# Patient Record
Sex: Female | Born: 1935 | Race: White | Hispanic: No
Health system: Southern US, Community
[De-identification: ages and names within clinical notes are randomized; demographics above are authoritative.]

## PROBLEM LIST (undated history)

## (undated) DIAGNOSIS — A419 Sepsis, unspecified organism: Secondary | ICD-10-CM

## (undated) DIAGNOSIS — B20 Human immunodeficiency virus [HIV] disease: Secondary | ICD-10-CM

## (undated) DIAGNOSIS — G894 Chronic pain syndrome: Secondary | ICD-10-CM

## (undated) DIAGNOSIS — N390 Urinary tract infection, site not specified: Secondary | ICD-10-CM

## (undated) DIAGNOSIS — Z9189 Other specified personal risk factors, not elsewhere classified: Secondary | ICD-10-CM

## (undated) DIAGNOSIS — J9621 Acute and chronic respiratory failure with hypoxia: Secondary | ICD-10-CM

---

## 2018-05-02 ENCOUNTER — Other Ambulatory Visit (HOSPITAL_COMMUNITY): Payer: Medicare Other

## 2018-05-02 ENCOUNTER — Inpatient Hospital Stay
Admission: RE | Admit: 2018-05-02 | Discharge: 2018-05-17 | Disposition: E | Payer: Medicare Other | Source: Ambulatory Visit | Attending: Internal Medicine | Admitting: Internal Medicine

## 2018-05-02 DIAGNOSIS — J9621 Acute and chronic respiratory failure with hypoxia: Secondary | ICD-10-CM

## 2018-05-02 DIAGNOSIS — A419 Sepsis, unspecified organism: Secondary | ICD-10-CM

## 2018-05-02 DIAGNOSIS — J9690 Respiratory failure, unspecified, unspecified whether with hypoxia or hypercapnia: Secondary | ICD-10-CM

## 2018-05-02 DIAGNOSIS — G894 Chronic pain syndrome: Secondary | ICD-10-CM

## 2018-05-02 DIAGNOSIS — Z9189 Other specified personal risk factors, not elsewhere classified: Secondary | ICD-10-CM

## 2018-05-02 DIAGNOSIS — Z0189 Encounter for other specified special examinations: Secondary | ICD-10-CM

## 2018-05-02 DIAGNOSIS — N390 Urinary tract infection, site not specified: Secondary | ICD-10-CM

## 2018-05-02 DIAGNOSIS — Z4659 Encounter for fitting and adjustment of other gastrointestinal appliance and device: Secondary | ICD-10-CM

## 2018-05-02 DIAGNOSIS — R6520 Severe sepsis without septic shock: Secondary | ICD-10-CM

## 2018-05-02 HISTORY — DX: Other specified personal risk factors, not elsewhere classified: Z91.89

## 2018-05-02 HISTORY — DX: Sepsis, unspecified organism: A41.9

## 2018-05-02 HISTORY — DX: Urinary tract infection, site not specified: N39.0

## 2018-05-02 HISTORY — DX: Human immunodeficiency virus (HIV) disease: B20

## 2018-05-02 HISTORY — DX: Acute and chronic respiratory failure with hypoxia: J96.21

## 2018-05-02 HISTORY — DX: Chronic pain syndrome: G89.4

## 2018-05-02 LAB — BLOOD GAS, ARTERIAL
Acid-base deficit: 2.8 mmol/L — ABNORMAL HIGH (ref 0.0–2.0)
BICARBONATE: 19.9 mmol/L — AB (ref 20.0–28.0)
FIO2: 35
LHR: 16 {breaths}/min
MECHVT: 500 mL
O2 Saturation: 98.4 %
PEEP: 5 cmH2O
PO2 ART: 105 mmHg (ref 83.0–108.0)
Patient temperature: 98.6
pCO2 arterial: 25.7 mmHg — ABNORMAL LOW (ref 32.0–48.0)
pH, Arterial: 7.501 — ABNORMAL HIGH (ref 7.350–7.450)

## 2018-05-03 ENCOUNTER — Other Ambulatory Visit (HOSPITAL_COMMUNITY): Payer: Medicare Other

## 2018-05-03 LAB — COMPREHENSIVE METABOLIC PANEL
ALT: 16 U/L (ref 0–44)
ANION GAP: 15 (ref 5–15)
AST: 12 U/L — AB (ref 15–41)
Albumin: 2.2 g/dL — ABNORMAL LOW (ref 3.5–5.0)
Alkaline Phosphatase: 109 U/L (ref 38–126)
BUN: 29 mg/dL — AB (ref 8–23)
CHLORIDE: 107 mmol/L (ref 98–111)
CO2: 17 mmol/L — ABNORMAL LOW (ref 22–32)
Calcium: 8.5 mg/dL — ABNORMAL LOW (ref 8.9–10.3)
Creatinine, Ser: 0.56 mg/dL (ref 0.44–1.00)
GFR calc Af Amer: >60 mL/min (ref >60)
GFR calc non Af Amer: >60 mL/min (ref >60)
GLUCOSE: 143 mg/dL — AB (ref 70–99)
POTASSIUM: 3.3 mmol/L — AB (ref 3.5–5.1)
SODIUM: 139 mmol/L (ref 135–145)
Total Bilirubin: 0.9 mg/dL (ref 0.3–1.2)
Total Protein: 4.8 g/dL — ABNORMAL LOW (ref 6.5–8.1)

## 2018-05-03 LAB — CBC WITH DIFFERENTIAL/PLATELET
BAND NEUTROPHILS: 1 %
BASOS ABS: 0 10*3/uL (ref 0.0–0.1)
Basophils Relative: 0 %
Blasts: 0 %
EOS ABS: 0 10*3/uL (ref 0.0–0.7)
EOS PCT: 1 %
HCT: 31.2 % — ABNORMAL LOW (ref 36.0–46.0)
HEMOGLOBIN: 9.7 g/dL — AB (ref 12.0–15.0)
LYMPHS ABS: 0.6 10*3/uL — AB (ref 0.7–4.0)
LYMPHS PCT: 15 %
MCH: 27.8 pg (ref 26.0–34.0)
MCHC: 31.1 g/dL (ref 30.0–36.0)
MCV: 89.4 fL (ref 78.0–100.0)
METAMYELOCYTES PCT: 0 %
MONOS PCT: 11 %
Monocytes Absolute: 0.5 10*3/uL (ref 0.1–1.0)
Myelocytes: 0 %
NEUTROS ABS: 3.1 10*3/uL (ref 1.7–7.7)
Neutrophils Relative %: 72 %
OTHER: 0 %
Platelets: 206 10*3/uL (ref 150–400)
Promyelocytes Relative: 0 %
RBC: 3.49 MIL/uL — AB (ref 3.87–5.11)
Smear Review: ADEQUATE
WBC: 4.2 10*3/uL (ref 4.0–10.5)
nRBC: 0 /100 WBC

## 2018-05-03 MED ORDER — IOPAMIDOL (ISOVUE-300) INJECTION 61%
50.0000 mL | Freq: Once | INTRAVENOUS | Status: AC | PRN
  Administered 2018-05-03: 15 mL

## 2018-05-03 MED ORDER — LIDOCAINE VISCOUS HCL 2 % MT SOLN
OROMUCOSAL | Status: AC
  Administered 2018-05-03: 3 mL via OROMUCOSAL
  Filled 2018-05-03: qty 15

## 2018-05-03 MED ORDER — LIDOCAINE VISCOUS HCL 2 % MT SOLN
15.0000 mL | Freq: Once | OROMUCOSAL | Status: AC
  Administered 2018-05-03: 3 mL via OROMUCOSAL

## 2018-05-04 ENCOUNTER — Encounter: Payer: Self-pay | Admitting: Internal Medicine

## 2018-05-04 DIAGNOSIS — B20 Human immunodeficiency virus [HIV] disease: Secondary | ICD-10-CM

## 2018-05-04 DIAGNOSIS — Z9189 Other specified personal risk factors, not elsewhere classified: Secondary | ICD-10-CM

## 2018-05-04 DIAGNOSIS — J9621 Acute and chronic respiratory failure with hypoxia: Secondary | ICD-10-CM

## 2018-05-04 DIAGNOSIS — A419 Sepsis, unspecified organism: Secondary | ICD-10-CM

## 2018-05-04 DIAGNOSIS — G894 Chronic pain syndrome: Secondary | ICD-10-CM | POA: Diagnosis not present

## 2018-05-04 DIAGNOSIS — N390 Urinary tract infection, site not specified: Secondary | ICD-10-CM

## 2018-05-04 HISTORY — DX: Chronic pain syndrome: G89.4

## 2018-05-04 HISTORY — DX: Acute and chronic respiratory failure with hypoxia: J96.21

## 2018-05-04 HISTORY — DX: Sepsis, unspecified organism: A41.9

## 2018-05-04 HISTORY — DX: Other specified personal risk factors, not elsewhere classified: Z91.89

## 2018-05-04 HISTORY — DX: Urinary tract infection, site not specified: N39.0

## 2018-05-04 LAB — POTASSIUM: POTASSIUM: 3.5 mmol/L (ref 3.5–5.1)

## 2018-05-04 NOTE — Consult Note (Signed)
[facility]  Date of Service: 05/04/2018  PULMONARY CRITICAL CARE CONSULT   Erika BalzarineJanette B Torres  ZOX:[id number]  DOB: 11/10/1931   DOA: 04/19/2018  Referring Physician: Carron CurieAli Hijazi, MD  HPI: Erika Torres is a 84 y.o. female seen for follow up of Acute on Chronic Respiratory Failure.  Patient has full medical problems including coronary artery disease chronic pain syndrome hip arthroplasty came into the hospital at the transferring facility with increasing shortness of breath.  Patient also was having been having orthopnea.  With orthopnea she had been noted to have a hemoglobin of 7.3 was transfused and GI consultation was ordered at a previous admission.  Patient apparently refused the EGD and colonoscopy at that time.  Upon her readmission her hemoglobin had been 10.1 and she had an abnormal urine exam suggesting urinary tract infection.  Patient was started on Rocephin and admitted for further management.  Based on the discharge summary are not quite clear as to what was concluded as far as her shortness of breath is concerned.  It appears that they felt the patient was septic and this was contributing to her shortness of breath.  Review of Systems:  ROS performed and is unremarkable other than noted above.  Past Medical History:  Diagnosis Date  . Anxiety and depression  . Arthritis  . Chronic back pain  . Coronary artery disease  . Fractures  . History of blood transfusion  . History of UTI  . Iron deficiency anemia   Past Surgical History:  Procedure Laterality Date  . HYSTERECTOMY  . ORTHOPEDIC SURGERY     Social History:   No history of alcohol drug or tobacco use  Family History: Non-Contributory to the present illness    Medications: Reviewed on Rounds  Physical Exam:  Vitals: Temperature 97.0 pulse 77 respiratory rate 16 blood pressure 110/63 saturations are 100%  Ventilator Settings  mode of ventilation assist control FiO2 28% tidal volume 524 PEEP 5  . General: Comfortable at this time . Eyes: Grossly normal lids, irises & conjunctiva . ENT: grossly tongue is normal . Neck: no obvious mass . Cardiovascular: S1-S2 normal no gallop or rub . Respiratory: Coarse rhonchi noted bilaterally . Abdomen: Soft and nontender . Skin: no rash seen on limited exam . Musculoskeletal: not rigid . Psychiatric:unable to assess . Neurologic: no seizure no involuntary movements         Labs on Admission:  Basic Metabolic Panel: Recent Labs  Lab 05/03/18 0629 05/04/18 0210  NA 139  --   K 3.3* 3.5  CL 107  --   CO2 17*  --   GLUCOSE 143*  --   BUN 29*  --   CREATININE 0.56  --   CALCIUM 8.5*  --     Recent Labs  Lab 04/29/2018 1900  PHART 7.501*  PCO2ART 25.7*  PO2ART 105  HCO3 19.9*  O2SAT 98.4    Liver Function Tests: Recent Labs  Lab 05/03/18 0629  AST 12*  ALT 16  ALKPHOS 109  BILITOT 0.9  PROT 4.8*  ALBUMIN 2.2*   No results for input(s): LIPASE, AMYLASE in the last 168 hours. No results for input(s): AMMONIA in the last 168 hours.  CBC: Recent Labs  Lab 05/03/18 0629  WBC 4.2  NEUTROABS 3.1  HGB 9.7*  HCT 31.2*  MCV 89.4  PLT 206    Cardiac Enzymes: No results for input(s): CKTOTAL, CKMB, CKMBINDEX, TROPONINI in the  last 168 hours.  BNP (last 3 results) No results for input(s): BNP in the last 8760 hours.  ProBNP (last 3 results) No results for input(s): PROBNP in the last 8760 hours.   Radiological Exams on Admission: Dg Abd 1 View  Result Date: 05/03/2018 CLINICAL DATA:  Fluoroscopic nasogastric tube placement EXAM: ABDOMEN - 1 VIEW COMPARISON:  None. FINDINGS: Fluoroscopic assistance was provided during placement of a nasoenteric feeding tube. The provided image shows contrast injection opacifying a loop of small bowel. Fluoroscopy time was reported as 4 minutes 42 seconds. The contrast used was 15 mL Isovue-300. IMPRESSION:  Fluoroscopic assistance during feeding tube placement with contrast opacification of proximal small bowel. Electronically Signed   By: Deatra Robinson M.D.   On: 05/03/2018 15:29   Dg Chest Port 1 View  Result Date: 04/17/2018 CLINICAL DATA:  Encounter for ETT placement. EXAM: PORTABLE CHEST 1 VIEW COMPARISON:  None. FINDINGS: Normal heart size. BILATERAL pleural effusions, with basilar compressive atelectasis. No definite consolidation. ET tube 4.5 cm above carina. No visible pneumothorax. Osteopenia, multiple thoracic compression fractures. Central venous catheter tip cavoatrial junction from RIGHT IJ approach. IMPRESSION: ET tube 4.5 cm above carina. Central venous line tip cavoatrial junction. No pneumothorax. BILATERAL pleural effusions as described. Electronically Signed   By: Elsie Stain M.D.   On: 04/20/2018 17:10   Dg Abd Portable 1v  Result Date: 05/04/2018 CLINICAL DATA:  Encounter for NG tube placement. EXAM: PORTABLE ABDOMEN - 1 VIEW COMPARISON:  None. FINDINGS: The bowel gas pattern is normal. No radio-opaque calculi or other significant radiographic abnormality are seen. IMPRESSION: Unremarkable supine radiograph. A nasogastric tube is not identified in the chest or stomach. Electronically Signed   By: Elsie Stain M.D.   On: 04/27/2018 17:11    Assessment/Plan Principal Problem:   Acute on chronic respiratory failure with hypoxia (HCC) Active Problems:   Sepsis associated with acquired immune deficiency syndrome (AIDS) (HCC)   Chronic pain syndrome   Urinary tract infection   At high risk for airway occlusion   1. Acute on chronic respiratory failure with hypoxia right now patient is on full vent support and assist control mode.  Patient remains on a propofol drip which of course makes it difficult for any weaning attempts.  We will try to work on trying to titrate the propofol down to off.  We will continue with support on the ventilator.  She has an ET tube in place we will  assess once she is more awake if she may end up having to have a tracheostomy. 2. Sepsis patient was treated for sepsis secondary to urinary tract infection.  Patient right now is hemodynamically stable.  We will continue with full support and pressors as needed. 3. Chronic pain syndrome we will continue with current management pain meds as needed.  She is now more than adequately sedated. 4. High risk airway patient has an endotracheal tube in place as noted.  She is at high risk for extubation patient right now is sedated on propofol we will continue with full support and continue with continuous monitoring  I have personally seen and evaluated the patient, evaluated laboratory and imaging results, formulated the assessment and plan and placed orders. The Patient requires high complexity decision making for assessment and support.  Case was discussed on Rounds with the Respiratory Therapy Staff Time Spent patient is critically ill in danger of cardiac arrest with high risk airway as well as high risk drips  Yevonne Pax,  MD [facility] Pulmonary Critical Care Medicine Sleep Medicine

## 2018-05-05 DIAGNOSIS — J9621 Acute and chronic respiratory failure with hypoxia: Secondary | ICD-10-CM | POA: Diagnosis not present

## 2018-05-05 DIAGNOSIS — G894 Chronic pain syndrome: Secondary | ICD-10-CM | POA: Diagnosis not present

## 2018-05-05 DIAGNOSIS — Z9189 Other specified personal risk factors, not elsewhere classified: Secondary | ICD-10-CM | POA: Diagnosis not present

## 2018-05-05 DIAGNOSIS — R6520 Severe sepsis without septic shock: Secondary | ICD-10-CM

## 2018-05-05 DIAGNOSIS — A419 Sepsis, unspecified organism: Secondary | ICD-10-CM | POA: Diagnosis not present

## 2018-05-05 NOTE — Progress Notes (Signed)
Pulmonary Critical Care Medicine [facility] [place]   PULMONARY CRITICAL CARE SERVICE  PROGRESS NOTE  Date of Service: 05/05/2018  Erika Torres  ZOX:[id number]  DOB: 1932/05/02   DOA: 2018/05/26  Referring Physician: Carron Curie, MD  HPI: Erika Torres is a 84 y.o. female seen for follow up of Acute on Chronic Respiratory Failure.  Patient currently is on full support right now has been on assist control mode patient is supposed to start pressure support wean with 4-hour goal  Medications: Reviewed on Rounds  Physical Exam:  Vitals: Temperature 97.2 pulse 91 respiratory rate 16 blood pressure 136/76 saturations 96%  Ventilator Settings mode of ventilation assist control FiO2 28% tidal volume 518 PEEP 5  . General: Comfortable at this time . Eyes: Grossly normal lids, irises & conjunctiva . ENT: grossly tongue is normal . Neck: no obvious mass . Cardiovascular: S1 S2 normal no gallop . Respiratory: No rhonchi no rales are noted . Abdomen: soft . Skin: no rash seen on limited exam . Musculoskeletal: not rigid . Psychiatric:unable to assess . Neurologic: no seizure no involuntary movements         Lab Data:   Basic Metabolic Panel: Recent Labs  Lab 05/03/18 0629 05/04/18 0210  NA 139  --   K 3.3* 3.5  CL 107  --   CO2 17*  --   GLUCOSE 143*  --   BUN 29*  --   CREATININE 0.56  --   CALCIUM 8.5*  --     ABG: Recent Labs  Lab 05-26-18 1900  PHART 7.501*  PCO2ART 25.7*  PO2ART 105  HCO3 19.9*  O2SAT 98.4    Liver Function Tests: Recent Labs  Lab 05/03/18 0629  AST 12*  ALT 16  ALKPHOS 109  BILITOT 0.9  PROT 4.8*  ALBUMIN 2.2*   No results for input(s): LIPASE, AMYLASE in the last 168 hours. No results for input(s): AMMONIA in the last 168 hours.  CBC: Recent Labs  Lab 05/03/18 0629  WBC 4.2  NEUTROABS 3.1  HGB 9.7*  HCT 31.2*  MCV 89.4  PLT 206    Cardiac Enzymes: No results for input(s): CKTOTAL, CKMB, CKMBINDEX,  TROPONINI in the last 168 hours.  BNP (last 3 results) No results for input(s): BNP in the last 8760 hours.  ProBNP (last 3 results) No results for input(s): PROBNP in the last 8760 hours.  Radiological Exams: Dg Abd 1 View  Result Date: 05/03/2018 CLINICAL DATA:  Fluoroscopic nasogastric tube placement EXAM: ABDOMEN - 1 VIEW COMPARISON:  None. FINDINGS: Fluoroscopic assistance was provided during placement of a nasoenteric feeding tube. The provided image shows contrast injection opacifying a loop of small bowel. Fluoroscopy time was reported as 4 minutes 42 seconds. The contrast used was 15 mL [id number]-300. IMPRESSION: Fluoroscopic assistance during feeding tube placement with contrast opacification of proximal small bowel. Electronically Signed   By: Deatra Robinson M.D.   On: 05/03/2018 15:29    Assessment/Plan Principal Problem:   Acute on chronic respiratory failure with hypoxia (HCC) Active Problems:   Chronic pain syndrome   Urinary tract infection   At high risk for airway occlusion   Severe sepsis (HCC)   1. Acute on chronic respiratory failure with hypoxia we will continue with the weaning on pressure support patient should be starting that today with a goal of 4 hours. 2. Sepsis with shock continue with supportive care hemodynamically stable at this time. 3. Chronic pain syndrome continue with supportive care.  4. Urinary tract infection treated 5. High risk airway she has an ET tube in place we are going to try to continue to wean if not able to then she will need a tracheostomy.   I have personally seen and evaluated the patient, evaluated laboratory and imaging results, formulated the assessment and plan and placed orders. The Patient requires high complexity decision making for assessment and support.  Case was discussed on Rounds with the Respiratory Therapy Staff  Yevonne PaxSaadat A Khan, MD [facility] Pulmonary Critical Care Medicine Sleep Medicine

## 2018-05-06 DIAGNOSIS — G894 Chronic pain syndrome: Secondary | ICD-10-CM | POA: Diagnosis not present

## 2018-05-06 DIAGNOSIS — Z9189 Other specified personal risk factors, not elsewhere classified: Secondary | ICD-10-CM | POA: Diagnosis not present

## 2018-05-06 DIAGNOSIS — A419 Sepsis, unspecified organism: Secondary | ICD-10-CM | POA: Diagnosis not present

## 2018-05-06 DIAGNOSIS — J9621 Acute and chronic respiratory failure with hypoxia: Secondary | ICD-10-CM | POA: Diagnosis not present

## 2018-05-06 NOTE — Progress Notes (Signed)
[facility] [place]   PULMONARY CRITICAL CARE SERVICE  PROGRESS NOTE  Date of Service: 05/06/2018  Erika Torres  ZOX:[id number]  DOB: 07/04/1932   DOA: 04/30/2018  Referring Physician: Carron CurieAli Hijazi, MD  HPI: Erika Torres is a 84 y.o. female seen for follow up of Acute on Chronic Respiratory Failure.  Patient is on full support yesterday was able to do pressure support wean for only about 1 hour  Medications: Reviewed on Rounds  Physical Exam:  Vitals: Temperature 98.7 pulse 94 respiratory 18 blood pressure 118/62 saturations are 97%  Ventilator Settings mode of ventilation assist control FiO2 28% tidal volume 495 PEEP 5  . General: Comfortable at this time . Eyes: Grossly normal lids, irises & conjunctiva . ENT: grossly tongue is normal . Neck: no obvious mass . Cardiovascular: S1 S2 normal no gallop . Respiratory: Scattered rhonchi expansion is equal . Abdomen: soft . Skin: no rash seen on limited exam . Musculoskeletal: not rigid . Psychiatric:unable to assess . Neurologic: no seizure no involuntary movements         Lab Data:   Basic Metabolic Panel: Recent Labs  Lab 05/03/18 0629 05/04/18 0210  NA 139  --   K 3.3* 3.5  CL 107  --   CO2 17*  --   GLUCOSE 143*  --   BUN 29*  --   CREATININE 0.56  --   CALCIUM 8.5*  --     ABG: Recent Labs  Lab 04/30/2018 1900  PHART 7.501*  PCO2ART 25.7*  PO2ART 105  HCO3 19.9*  O2SAT 98.4    Liver Function Tests: Recent Labs  Lab 05/03/18 0629  AST 12*  ALT 16  ALKPHOS 109  BILITOT 0.9  PROT 4.8*  ALBUMIN 2.2*   No results for input(s): LIPASE, AMYLASE in the last 168 hours. No results for input(s): AMMONIA in the last 168 hours.  CBC: Recent Labs  Lab 05/03/18 0629  WBC 4.2  NEUTROABS 3.1  HGB 9.7*  HCT 31.2*  MCV 89.4  PLT 206    Cardiac Enzymes: No results for input(s): CKTOTAL, CKMB, CKMBINDEX, TROPONINI in the last 168 hours.  BNP (last  3 results) No results for input(s): BNP in the last 8760 hours.  ProBNP (last 3 results) No results for input(s): PROBNP in the last 8760 hours.  Radiological Exams: No results found.  Assessment/Plan Principal Problem:   Acute on chronic respiratory failure with hypoxia (HCC) Active Problems:   Chronic pain syndrome   Urinary tract infection   At high risk for airway occlusion   Severe sepsis (HCC)   1. Acute on chronic respiratory failure with hypoxia we will continue with full support and try to wean on pressure support again today. 2. Chronic pain syndrome controlled we will monitor 3. Urinary tract infection treated continue with supportive care 4. Severe sepsis syndrome hemodynamically stable we will continue with supportive care 5. High risk airway we will continue airway precautions prognosis guarded   I have personally seen and evaluated the patient, evaluated laboratory and imaging results, formulated the assessment and plan and placed orders. The Patient requires high complexity decision making for assessment and support.  Case was discussed on Rounds with the Respiratory Therapy Staff  Yevonne PaxSaadat A Ezzie Senat, MD [facility] Pulmonary Critical Care Medicine Sleep Medicine

## 2018-05-07 DIAGNOSIS — J9621 Acute and chronic respiratory failure with hypoxia: Secondary | ICD-10-CM | POA: Diagnosis not present

## 2018-05-07 DIAGNOSIS — Z9189 Other specified personal risk factors, not elsewhere classified: Secondary | ICD-10-CM | POA: Diagnosis not present

## 2018-05-07 DIAGNOSIS — A419 Sepsis, unspecified organism: Secondary | ICD-10-CM | POA: Diagnosis not present

## 2018-05-07 DIAGNOSIS — G894 Chronic pain syndrome: Secondary | ICD-10-CM | POA: Diagnosis not present

## 2018-05-07 NOTE — Progress Notes (Signed)
[facility] [place]   PULMONARY CRITICAL CARE SERVICE  PROGRESS NOTE  Date of Service: 05/07/2018  Drema BalzarineJanette B Gonsalez  ZOX:[id number]  DOB: 12/02/1931   DOA: 2017-09-27  Referring Physician: Carron CurieAli Hijazi, MD  HPI: Drema BalzarineJanette B Rogue is a 84 y.o. female seen for follow up of Acute on Chronic Respiratory Failure.  Patient is on full vent support.  Was attempted on pressure support did not tolerate.  Patient was placed back on the ventilator and full support.  Medications: Reviewed on Rounds  Physical Exam:  Vitals: Temperature 98.9 pulse 85 respiratory rate 16 blood pressure 123/69 saturations 98%  Ventilator Settings mode of ventilation assist control FiO2 28% tidal volume 532 PEEP 5  . General: Comfortable at this time . Eyes: Grossly normal lids, irises & conjunctiva . ENT: grossly tongue is normal . Neck: no obvious mass . Cardiovascular: S1 S2 normal no gallop . Respiratory: Few rhonchi no rales are noted . Abdomen: soft . Skin: no rash seen on limited exam . Musculoskeletal: not rigid . Psychiatric:unable to assess . Neurologic: no seizure no involuntary movements         Lab Data:   Basic Metabolic Panel: Recent Labs  Lab 05/03/18 0629 05/04/18 0210  NA 139  --   K 3.3* 3.5  CL 107  --   CO2 17*  --   GLUCOSE 143*  --   BUN 29*  --   CREATININE 0.56  --   CALCIUM 8.5*  --     ABG: Recent Labs  Lab April 13, 2018 1900  PHART 7.501*  PCO2ART 25.7*  PO2ART 105  HCO3 19.9*  O2SAT 98.4    Liver Function Tests: Recent Labs  Lab 05/03/18 0629  AST 12*  ALT 16  ALKPHOS 109  BILITOT 0.9  PROT 4.8*  ALBUMIN 2.2*   No results for input(s): LIPASE, AMYLASE in the last 168 hours. No results for input(s): AMMONIA in the last 168 hours.  CBC: Recent Labs  Lab 05/03/18 0629  WBC 4.2  NEUTROABS 3.1  HGB 9.7*  HCT 31.2*  MCV 89.4  PLT 206    Cardiac Enzymes: No results for input(s): CKTOTAL, CKMB, CKMBINDEX,  TROPONINI in the last 168 hours.  BNP (last 3 results) No results for input(s): BNP in the last 8760 hours.  ProBNP (last 3 results) No results for input(s): PROBNP in the last 8760 hours.  Radiological Exams: No results found.  Assessment/Plan Principal Problem:   Acute on chronic respiratory failure with hypoxia (HCC) Active Problems:   Chronic pain syndrome   Urinary tract infection   At high risk for airway occlusion   Severe sepsis (HCC)   1. Acute on chronic respiratory failure with hypoxia we will continue with full vent support on assist control.  Patient has an endotracheal tube in place.  She will need a tracheostomy.  I placed a consultation for ENT to take a look at the patient and schedule for trach 2. High risk airway continue with supportive care she has an ET tube in place 3. Severe sepsis treated with antibiotics we will monitor hemodynamics 4. Chronic pain syndrome stable 5. Urinary tract infection treated we will continue with supportive care   I have personally seen and evaluated the patient, evaluated laboratory and imaging results, formulated the assessment and plan and placed orders. The Patient requires high complexity decision making for assessment and support.  Case was discussed on Rounds with the Respiratory Therapy Staff  Holland Nickson A  Humphrey Rolls, MD [facility] Pulmonary Critical Care Medicine Sleep Medicine

## 2018-05-08 DIAGNOSIS — A419 Sepsis, unspecified organism: Secondary | ICD-10-CM | POA: Diagnosis not present

## 2018-05-08 DIAGNOSIS — Z9189 Other specified personal risk factors, not elsewhere classified: Secondary | ICD-10-CM | POA: Diagnosis not present

## 2018-05-08 DIAGNOSIS — G894 Chronic pain syndrome: Secondary | ICD-10-CM | POA: Diagnosis not present

## 2018-05-08 DIAGNOSIS — J9621 Acute and chronic respiratory failure with hypoxia: Secondary | ICD-10-CM | POA: Diagnosis not present

## 2018-05-08 NOTE — Progress Notes (Signed)
Pulmonary Critical Care Medicine [facility] [place]   PULMONARY CRITICAL CARE SERVICE  PROGRESS NOTE  Date of Service: 05/08/2018  Erika Torres  ZOX:[id number]  DOB: Aug 20, 1931   DOA: 05/06/2018  Referring Physician: Carron Curie, MD  HPI: Erika Torres is a 84 y.o. female seen for follow up of Acute on Chronic Respiratory Failure.  I spoke to the son grandson and the daughter-in-law to try to get a little bit more history about the patient.  Apparently she was being worked up for shortness of breath before she got critically ill and had to be intubated.  The patient was also diagnosed with possibility of congestive heart failure and the daughter-in-law stated that she had been on diuretics as an outpatient.  I do not see on care everywhere whether echocardiogram was done.  In addition she did have a chest x-ray done showing pleural effusions which are bilateral last film that was done with 6 days ago.  The patient also had a CT scan done which had shown her spinal curvature abnormalities along with vertebral abnormalities the patient may have some form of restrictive lung disease and I would also be concerned whether there is an element of congestive heart failure which may be playing a role in her failure to wean off the ventilator.  As far as advanced directives the son stated that she does have a living well he does not know what is in the living will and he is going to try to get a copy of it from her lawyer for [place].  The patients son is not sure whether they want to withdraw the ventilator at this time or whether they want to do a tracheostomy at this time.  I had a very candid discussion with them regarding what the options are and also did state to them that I would like to investigate her cardiac status a little bit further with an echocardiogram and also get a CT scan of her chest is so to get a better picture of her underlying lung parenchyma.  She was apparently a non-smoker  all her life and she did however have some kyphoscoliosis as already noted.  Medications: Reviewed on Rounds  Physical Exam:  Vitals: Temperature 99.1 pulse 88 respiratory rate 20 blood pressure 125/60 saturations 94%  Ventilator Settings mode of ventilation assist control FiO2 28% tidal volume 530 PEEP 5  . General: Comfortable at this time . Eyes: Grossly normal lids, irises & conjunctiva . ENT: grossly tongue is normal . Neck: no obvious mass . Cardiovascular: S1 S2 normal no gallop . Respiratory: Coarse breath sounds few rhonchi . Abdomen: soft . Skin: no rash seen on limited exam . Musculoskeletal: not rigid . Psychiatric:unable to assess . Neurologic: no seizure no involuntary movements         Lab Data:   Basic Metabolic Panel: Recent Labs  Lab 05/03/18 0629 05/04/18 0210  NA 139  --   K 3.3* 3.5  CL 107  --   CO2 17*  --   GLUCOSE 143*  --   BUN 29*  --   CREATININE 0.56  --   CALCIUM 8.5*  --     ABG: Recent Labs  Lab 2018/05/06 1900  PHART 7.501*  PCO2ART 25.7*  PO2ART 105  HCO3 19.9*  O2SAT 98.4    Liver Function Tests: Recent Labs  Lab 05/03/18 0629  AST 12*  ALT 16  ALKPHOS 109  BILITOT 0.9  PROT 4.8*  ALBUMIN 2.2*  No results for input(s): LIPASE, AMYLASE in the last 168 hours. No results for input(s): AMMONIA in the last 168 hours.  CBC: Recent Labs  Lab 05/03/18 0629  WBC 4.2  NEUTROABS 3.1  HGB 9.7*  HCT 31.2*  MCV 89.4  PLT 206    Cardiac Enzymes: No results for input(s): CKTOTAL, CKMB, CKMBINDEX, TROPONINI in the last 168 hours.  BNP (last 3 results) No results for input(s): BNP in the last 8760 hours.  ProBNP (last 3 results) No results for input(s): PROBNP in the last 8760 hours.  Radiological Exams: No results found.  Assessment/Plan Principal Problem:   Acute on chronic respiratory failure with hypoxia (HCC) Active Problems:   Chronic pain syndrome   Urinary tract infection   At high risk for airway  occlusion   Severe sepsis (HCC)   1. Acute on chronic respiratory failure with hypoxia patient has not been able to tolerate weaning I recommended that we get a CT scan of the chest to assess the effusions as well as the lung parenchyma.  Also suggested that we get a echocardiogram to assess the pulmonary pressures as well as left ventricular function. 2. Chronic pain likely related to her underlying scoliosis problem she also has collapsed vertebra noted on the chest films and as discussed with the patient's son. 3. Severe sepsis she has been treated with antibiotics we will continue with supportive care. 4. Urinary tract infection as above treated with antibiotics 5. Chronic pain syndrome she has been on chronic pain meds but according to her son she was not on any narcotics etc.  As noted above I had a lengthy discussion with the family regarding patient's prognosis however it is hard to say because the patient diagnosis is still not firm.  If she did have underlying pulmonary disease she never made it to the pulmonologist to get the work-up done.  It appears that she was suspected to have some cardiac issues but again the work-up was not completed before she felt well and ended up on the ventilator.  I would like to investigate these things a little bit further so we have some data and then the family might be able to make some decisions.   I have personally seen and evaluated the patient, evaluated laboratory and imaging results, formulated the assessment and plan and placed orders. The Patient requires high complexity decision making for assessment and support.  Case was discussed on Rounds with the Respiratory Therapy Staff time spent 35 minutes with counseling as well as discussion with the family regarding possible withdrawal of life support versus tracheostomy  Yevonne PaxSaadat A Khan, MD [facility] Pulmonary Critical Care Medicine Sleep Medicine

## 2018-05-09 ENCOUNTER — Ambulatory Visit (HOSPITAL_COMMUNITY): Payer: No Typology Code available for payment source | Attending: Internal Medicine

## 2018-05-09 ENCOUNTER — Other Ambulatory Visit (HOSPITAL_COMMUNITY): Payer: Medicare Other

## 2018-05-09 DIAGNOSIS — J9621 Acute and chronic respiratory failure with hypoxia: Secondary | ICD-10-CM | POA: Diagnosis not present

## 2018-05-09 DIAGNOSIS — I081 Rheumatic disorders of both mitral and tricuspid valves: Secondary | ICD-10-CM | POA: Insufficient documentation

## 2018-05-09 DIAGNOSIS — G894 Chronic pain syndrome: Secondary | ICD-10-CM | POA: Diagnosis not present

## 2018-05-09 DIAGNOSIS — I5031 Acute diastolic (congestive) heart failure: Secondary | ICD-10-CM | POA: Insufficient documentation

## 2018-05-09 DIAGNOSIS — G8929 Other chronic pain: Secondary | ICD-10-CM | POA: Diagnosis not present

## 2018-05-09 DIAGNOSIS — A419 Sepsis, unspecified organism: Secondary | ICD-10-CM | POA: Diagnosis not present

## 2018-05-09 DIAGNOSIS — Z9189 Other specified personal risk factors, not elsewhere classified: Secondary | ICD-10-CM | POA: Diagnosis not present

## 2018-05-09 LAB — CBC
HCT: 28.8 % — ABNORMAL LOW (ref 36.0–46.0)
Hemoglobin: 8.9 g/dL — ABNORMAL LOW (ref 12.0–15.0)
MCH: 28.9 pg (ref 26.0–34.0)
MCHC: 30.9 g/dL (ref 30.0–36.0)
MCV: 92.9 fL (ref 78.0–100.0)
Platelets: 230 10*3/uL (ref 150–400)
RBC: 3.1 MIL/uL — AB (ref 3.87–5.11)
WBC: 8.5 10*3/uL (ref 4.0–10.5)

## 2018-05-09 LAB — BASIC METABOLIC PANEL
Anion gap: 7 (ref 5–15)
BUN: 11 mg/dL (ref 8–23)
CO2: 23 mmol/L (ref 22–32)
CREATININE: 0.6 mg/dL (ref 0.44–1.00)
Calcium: 7.8 mg/dL — ABNORMAL LOW (ref 8.9–10.3)
Chloride: 101 mmol/L (ref 98–111)
GFR calc Af Amer: >60 mL/min (ref >60)
GFR calc non Af Amer: >60 mL/min (ref >60)
GLUCOSE: 119 mg/dL — AB (ref 70–99)
Potassium: 3.8 mmol/L (ref 3.5–5.1)
Sodium: 131 mmol/L — ABNORMAL LOW (ref 135–145)

## 2018-05-09 NOTE — Progress Notes (Signed)
  Echocardiogram 2D Echocardiogram has been performed.  Shona SimpsonLane, Marlys Stegmaier F 05/09/2018, 3:46 PM

## 2018-05-09 NOTE — Progress Notes (Signed)
Pulmonary Critical Care Medicine [facility] [place]   PULMONARY CRITICAL CARE SERVICE  PROGRESS NOTE  Date of Service: 05/09/2018  Erika Torres  ZOX:[id number]  DOB: April 05, 1932   DOA: 04/28/2018  Referring Physician: Carron Curie, MD  HPI: Erika Torres is a 84 y.o. female seen for follow up of Acute on Chronic Respiratory Failure.  Patient continues on the ventilator and assist control mode has been failing pressure support attempts.  Patient did have a CT scan the results of which are noted below.  Echocardiogram was done results are pending  Medications: Reviewed on Rounds  Physical Exam:  Vitals: Temperature 97.4 pulse 104 respiratory rate 20 blood pressure 148/73 saturations 91%  Ventilator Settings mode of ventilation assist control FiO2 28% tidal volume 500 PEEP 5  . General: Comfortable at this time . Eyes: Grossly normal lids, irises & conjunctiva . ENT: grossly tongue is normal . Neck: no obvious mass . Cardiovascular: S1 S2 normal no gallop . Respiratory: Coarse rhonchi are noted bilaterally . Abdomen: soft . Skin: no rash seen on limited exam . Musculoskeletal: not rigid . Psychiatric:unable to assess . Neurologic: no seizure no involuntary movements         Lab Data:   Basic Metabolic Panel: Recent Labs  Lab 05/03/18 0629 05/04/18 0210 05/09/18 0636  NA 139  --  131*  K 3.3* 3.5 3.8  CL 107  --  101  CO2 17*  --  23  GLUCOSE 143*  --  119*  BUN 29*  --  11  CREATININE 0.56  --  0.60  CALCIUM 8.5*  --  7.8*    ABG: Recent Labs  Lab 05/10/2018 1900  PHART 7.501*  PCO2ART 25.7*  PO2ART 105  HCO3 19.9*  O2SAT 98.4    Liver Function Tests: Recent Labs  Lab 05/03/18 0629  AST 12*  ALT 16  ALKPHOS 109  BILITOT 0.9  PROT 4.8*  ALBUMIN 2.2*   No results for input(s): LIPASE, AMYLASE in the last 168 hours. No results for input(s): AMMONIA in the last 168 hours.  CBC: Recent Labs  Lab 05/03/18 0629 05/09/18 0636  WBC  4.2 8.5  NEUTROABS 3.1  --   HGB 9.7* 8.9*  HCT 31.2* 28.8*  MCV 89.4 92.9  PLT 206 230    Cardiac Enzymes: No results for input(s): CKTOTAL, CKMB, CKMBINDEX, TROPONINI in the last 168 hours.  BNP (last 3 results) No results for input(s): BNP in the last 8760 hours.  ProBNP (last 3 results) No results for input(s): PROBNP in the last 8760 hours.  Radiological Exams: Ct Chest Wo Contrast  Result Date: 05/09/2018 CLINICAL DATA:  84 year old female with pleural effusions and hemoptysis. Subsequent encounter. EXAM: CT CHEST WITHOUT CONTRAST TECHNIQUE: Multidetector CT imaging of the chest was performed following the standard protocol without IV contrast. COMPARISON:  04/30/2018 chest x-ray.  No comparison chest CT. FINDINGS: Cardiovascular: Heart size top-normal. Coronary artery calcifications. Calcified aortic valve. Atherosclerotic changes thoracic aorta. Mild ectasia thoracic aorta without aneurysm. Mediastinum/Nodes: Increase number of normal size mediastinal lymph nodes. Moderate-size hiatal hernia. Nasogastric tube in place. Tip not imaged. Lungs/Pleura: Patient is intubated with endotracheal tube tip 3 cm above the carina. Bilateral lower lobe consolidation with air bronchograms without central obstructing lesion. Moderate right-sided and small to moderate left-sided pleural effusion. Right upper lobe wedge shaped small area of consolidation. Bibasilar pleural thickening with calcifications. 9 mm opacity right middle lobe level along minor fissure (series 5, image 61, series 7,  image 42 and series 6, image 30) possibly a lymph node in the fissure. Upper Abdomen: No worrisome abnormality. Musculoskeletal: Kyphoscoliosis. Acute/subacute fracture anterior inferior aspect T9 vertebra suspected. Remote appearing central loss of height T9 with 30-40% loss of height centrally. Remote appearing compression deformities including; anterior wedge compression fracture with 90% loss of height T7 and T12  (with mild retropulsion), anterior wedge compression deformity T6 and T8 with 60% loss of height and anterior wedge compression deformity T5 and T10 with 40% loss of height. IMPRESSION: 1. Patient is intubated with endotracheal tube tip 3 cm above the carina. 2. Bilateral lower lobe consolidation with air bronchograms without central obstructing lesion suspicious for bilateral infectious infiltrates with moderate right-sided and small to moderate left-sided pleural effusion. 3. 9 mm opacity right middle lobe level along minor fissure (series 5, image 61, series 7, image 42 and series 6, image 30) possibly a lymph node in the fissure. Unenhanced chest CT in 3 to 6 months can be obtained after infectious infiltrates have been treated at which time stability and/or clearing of this finding can be confirmed. On this follow-up examination, attention to right upper lobe wedge shaped small area of consolidation (series 5 image 48) and bibasilar pleural thickening recommended. These findings may represent chronic changes. 4. Moderate size hiatal hernia. 5. Kyphoscoliosis. Acute/subacute fracture anterior inferior aspect T9 vertebra suspected. Remote appearing central loss of height T9 with 30-40% loss of height centrally. Remote appearing compression deformities including; anterior wedge compression fracture with 90% loss of height T7 and T12 (with mild retropulsion), anterior wedge compression deformity T6 and T8 with 60% loss of height and anterior wedge compression deformity T5 and T10 with 40% loss of height. 6. Thoracic aortic calcifications and coronary artery calcifications. Aortic Atherosclerosis (ICD10-I70.0). Electronically Signed   By: Lacy DuverneySteven  Olson M.D.   On: 05/09/2018 07:03    Assessment/Plan Principal Problem:   Acute on chronic respiratory failure with hypoxia (HCC) Active Problems:   Chronic pain syndrome   Urinary tract infection   At high risk for airway occlusion   Severe sepsis  (HCC)   1. Acute on chronic respiratory failure with hypoxia we will continue with full vent support she is been failing weaning attempts.  Right now is on 28% FiO2 with a PEEP of 5 2. Severe kyphoscoliosis is noted on the CT scan with some acute subacute fractures noted around the T9 area.  The CT scan also showed bilateral lower lung consolidations with air bronchograms and no central obstructing lesions.  Patient's antibiotics will be assessed and discussed with the primary care team. 3. Chronic pain syndrome which is likely related to her underlying musculoskeletal issues.  We will continue with full supportive care and pain management. 4. Urinary tract infection treated with antibiotics we will continue with supportive care. 5. Severe sepsis clinically treated right now she is hemodynamically stable.   I have personally seen and evaluated the patient, evaluated laboratory and imaging results, formulated the assessment and plan and placed orders. The Patient requires high complexity decision making for assessment and support.  Case was discussed on Rounds with the Respiratory Therapy Staff time spent 35 minutes review of the radiological studies and discussion on multidisciplinary rounds  Yevonne PaxSaadat A Khan, MD [facility] Pulmonary Critical Care Medicine Sleep Medicine

## 2018-05-10 DIAGNOSIS — G894 Chronic pain syndrome: Secondary | ICD-10-CM | POA: Diagnosis not present

## 2018-05-10 DIAGNOSIS — A419 Sepsis, unspecified organism: Secondary | ICD-10-CM | POA: Diagnosis not present

## 2018-05-10 DIAGNOSIS — J9621 Acute and chronic respiratory failure with hypoxia: Secondary | ICD-10-CM | POA: Diagnosis not present

## 2018-05-10 DIAGNOSIS — Z9189 Other specified personal risk factors, not elsewhere classified: Secondary | ICD-10-CM | POA: Diagnosis not present

## 2018-05-10 NOTE — Progress Notes (Signed)
Pulmonary Critical Care Medicine [facility] [place]   PULMONARY CRITICAL CARE SERVICE  PROGRESS NOTE  Date of Service: 05/10/2018  Erika Torres  ZOX:[id number]  DOB: [birth year]   DOA: 05-10-2018  Referring Physician: Carron Curie, MD  HPI: Erika Torres is a 84 y.o. female seen for follow up of Acute on Chronic Respiratory Failure.  At this time patient is on full vent support pressure on 28% oxygen.  Had an echocardiogram done which shows abnormal aortic valve leaflets significant calcifications.  Medications: Reviewed on Rounds  Physical Exam:  Vitals: Temperature is 97.5 pulse 96 respiratory rate 16 blood pressure 133/58 saturation 97%  Ventilator Settings mode of ventilation assist control FiO2 28% tidal volume 503 PEEP 5  . General: Comfortable at this time . Eyes: Grossly normal lids, irises & conjunctiva . ENT: grossly tongue is normal . Neck: no obvious mass . Cardiovascular: S1 S2 normal no gallop . Respiratory: No rhonchi or rales are noted at this time. . Abdomen: soft . Skin: no rash seen on limited exam . Musculoskeletal: not rigid . Psychiatric:unable to assess . Neurologic: no seizure no involuntary movements         Lab Data:   Basic Metabolic Panel: Recent Labs  Lab 05/04/18 0210 05/09/18 0636  NA  --  131*  K 3.5 3.8  CL  --  101  CO2  --  23  GLUCOSE  --  119*  BUN  --  11  CREATININE  --  0.60  CALCIUM  --  7.8*    ABG: No results for input(s): PHART, PCO2ART, PO2ART, HCO3, O2SAT in the last 168 hours.  Liver Function Tests: No results for input(s): AST, ALT, ALKPHOS, BILITOT, PROT, ALBUMIN in the last 168 hours. No results for input(s): LIPASE, AMYLASE in the last 168 hours. No results for input(s): AMMONIA in the last 168 hours.  CBC: Recent Labs  Lab 05/09/18 0636  WBC 8.5  HGB 8.9*  HCT 28.8*  MCV 92.9  PLT 230    Cardiac Enzymes: No results for input(s): CKTOTAL, CKMB, CKMBINDEX, TROPONINI in the last  168 hours.  BNP (last 3 results) No results for input(s): BNP in the last 8760 hours.  ProBNP (last 3 results) No results for input(s): PROBNP in the last 8760 hours.  Radiological Exams: Ct Chest Wo Contrast  Result Date: 05/09/2018 CLINICAL DATA:  84 year old female with pleural effusions and hemoptysis. Subsequent encounter. EXAM: CT CHEST WITHOUT CONTRAST TECHNIQUE: Multidetector CT imaging of the chest was performed following the standard protocol without IV contrast. COMPARISON:  05/10/2018 chest x-ray.  No comparison chest CT. FINDINGS: Cardiovascular: Heart size top-normal. Coronary artery calcifications. Calcified aortic valve. Atherosclerotic changes thoracic aorta. Mild ectasia thoracic aorta without aneurysm. Mediastinum/Nodes: Increase number of normal size mediastinal lymph nodes. Moderate-size hiatal hernia. Nasogastric tube in place. Tip not imaged. Lungs/Pleura: Patient is intubated with endotracheal tube tip 3 cm above the carina. Bilateral lower lobe consolidation with air bronchograms without central obstructing lesion. Moderate right-sided and small to moderate left-sided pleural effusion. Right upper lobe wedge shaped small area of consolidation. Bibasilar pleural thickening with calcifications. 9 mm opacity right middle lobe level along minor fissure (series 5, image 61, series 7, image 42 and series 6, image 30) possibly a lymph node in the fissure. Upper Abdomen: No worrisome abnormality. Musculoskeletal: Kyphoscoliosis. Acute/subacute fracture anterior inferior aspect T9 vertebra suspected. Remote appearing central loss of height T9 with 30-40% loss of height centrally. Remote appearing compression deformities including; anterior  wedge compression fracture with 90% loss of height T7 and T12 (with mild retropulsion), anterior wedge compression deformity T6 and T8 with 60% loss of height and anterior wedge compression deformity T5 and T10 with 40% loss of height. IMPRESSION: 1.  Patient is intubated with endotracheal tube tip 3 cm above the carina. 2. Bilateral lower lobe consolidation with air bronchograms without central obstructing lesion suspicious for bilateral infectious infiltrates with moderate right-sided and small to moderate left-sided pleural effusion. 3. 9 mm opacity right middle lobe level along minor fissure (series 5, image 61, series 7, image 42 and series 6, image 30) possibly a lymph node in the fissure. Unenhanced chest CT in 3 to 6 months can be obtained after infectious infiltrates have been treated at which time stability and/or clearing of this finding can be confirmed. On this follow-up examination, attention to right upper lobe wedge shaped small area of consolidation (series 5 image 48) and bibasilar pleural thickening recommended. These findings may represent chronic changes. 4. Moderate size hiatal hernia. 5. Kyphoscoliosis. Acute/subacute fracture anterior inferior aspect T9 vertebra suspected. Remote appearing central loss of height T9 with 30-40% loss of height centrally. Remote appearing compression deformities including; anterior wedge compression fracture with 90% loss of height T7 and T12 (with mild retropulsion), anterior wedge compression deformity T6 and T8 with 60% loss of height and anterior wedge compression deformity T5 and T10 with 40% loss of height. 6. Thoracic aortic calcifications and coronary artery calcifications. Aortic Atherosclerosis (ICD10-I70.0). Electronically Signed   By: Lacy DuverneySteven  Olson M.D.   On: 05/09/2018 07:03    Assessment/Plan Principal Problem:   Acute on chronic respiratory failure with hypoxia (HCC) Active Problems:   Chronic pain syndrome   Urinary tract infection   At high risk for airway occlusion   Severe sepsis (HCC)   1. Acute on chronic respiratory failure with hypoxia we will continue with full vent support.  I think she has a combination of cardiopulmonary disease contributing to her failure to wean.  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 2. Chronic pain syndrome we will continue with supportive care 3. Urinary tract infection treated 4. Severe sepsis treated we will continue with present management 5. High risk airway the patient's family does not want a tracheostomy in the right now leaning towards comfort care which may be appropriate based on the patient's underlying medical condition   I have personally seen and evaluated the patient, evaluated laboratory and imaging results, formulated the assessment and plan and placed orders. The Patient requires high complexity decision making for assessment and support.  Case was discussed on Rounds with the Respiratory Therapy Staff  Yevonne PaxSaadat A Kaleeyah Cuffie, MD [facility] Pulmonary Critical Care Medicine Sleep Medicine

## 2018-05-17 NOTE — Progress Notes (Signed)
Pulmonary Critical Care Medicine [facility] [place]   PULMONARY CRITICAL CARE SERVICE  PROGRESS NOTE  Date of Service: May 27, 2018  Erika Torres  ZOX:[id number]  DOB: November 01, 1931   DOA: 04/25/2018  Referring Physician: Carron Curie, MD  HPI: Erika Torres is a 84 y.o. female seen for follow up of Acute on Chronic Respiratory Failure.  Patient's family was present this morning the grandson wanted to withdraw life support as the son at this time.  They understand that she may pass quickly  Medications: Reviewed on Rounds  Physical Exam:  Vitals: Temperature 96.6 pulse 88 respiratory rate 20 blood pressure 114/79 saturations 97%  Ventilator Settings mode of ventilation assist control FiO2 28% tidal volume 500 PEEP 5  . General: Comfortable at this time . Eyes: Grossly normal lids, irises & conjunctiva . ENT: grossly tongue is normal . Neck: no obvious mass . Cardiovascular: S1 S2 normal no gallop . Respiratory: No rhonchi no rales . Abdomen: soft . Skin: no rash seen on limited exam . Musculoskeletal: not rigid . Psychiatric:unable to assess . Neurologic: no seizure no involuntary movements         Lab Data:   Basic Metabolic Panel: Recent Labs  Lab 05/09/18 0636  NA 131*  K 3.8  CL 101  CO2 23  GLUCOSE 119*  BUN 11  CREATININE 0.60  CALCIUM 7.8*    ABG: No results for input(s): PHART, PCO2ART, PO2ART, HCO3, O2SAT in the last 168 hours.  Liver Function Tests: No results for input(s): AST, ALT, ALKPHOS, BILITOT, PROT, ALBUMIN in the last 168 hours. No results for input(s): LIPASE, AMYLASE in the last 168 hours. No results for input(s): AMMONIA in the last 168 hours.  CBC: Recent Labs  Lab 05/09/18 0636  WBC 8.5  HGB 8.9*  HCT 28.8*  MCV 92.9  PLT 230    Cardiac Enzymes: No results for input(s): CKTOTAL, CKMB, CKMBINDEX, TROPONINI in the last 168 hours.  BNP (last 3 results) No results for input(s): BNP in the last 8760  hours.  ProBNP (last 3 results) No results for input(s): PROBNP in the last 8760 hours.  Radiological Exams: No results found.  Assessment/Plan Principal Problem:   Acute on chronic respiratory failure with hypoxia (HCC) Active Problems:   Chronic pain syndrome   Urinary tract infection   At high risk for airway occlusion   Severe sepsis (HCC)   1. Acute on chronic respiratory failure with hypoxia patient is going to be made comfort care and she will be extubated in accordance with the family wishes. 2. Chronic pain syndrome stable we will keep her pain-free 3. Severe sepsis unchanged 4. Urinary tract infection unchanged patient will be made comfort care as noted previously   I have personally seen and evaluated the patient, evaluated laboratory and imaging results, formulated the assessment and plan and placed orders. The Patient requires high complexity decision making for assessment and support.  Case was discussed on Rounds with the Respiratory Therapy Staff  Yevonne Pax, MD [facility] Pulmonary Critical Care Medicine Sleep Medicine

## 2018-05-17 DEATH — deceased

## 2020-05-14 IMAGING — DX DG ABD PORTABLE 1V
1 series · 1 of 1 positions shown · non-contrast
Comparison: None.

CLINICAL DATA: Encounter for NG tube placement.

EXAM:
PORTABLE ABDOMEN - 1 VIEW

[abdomen]
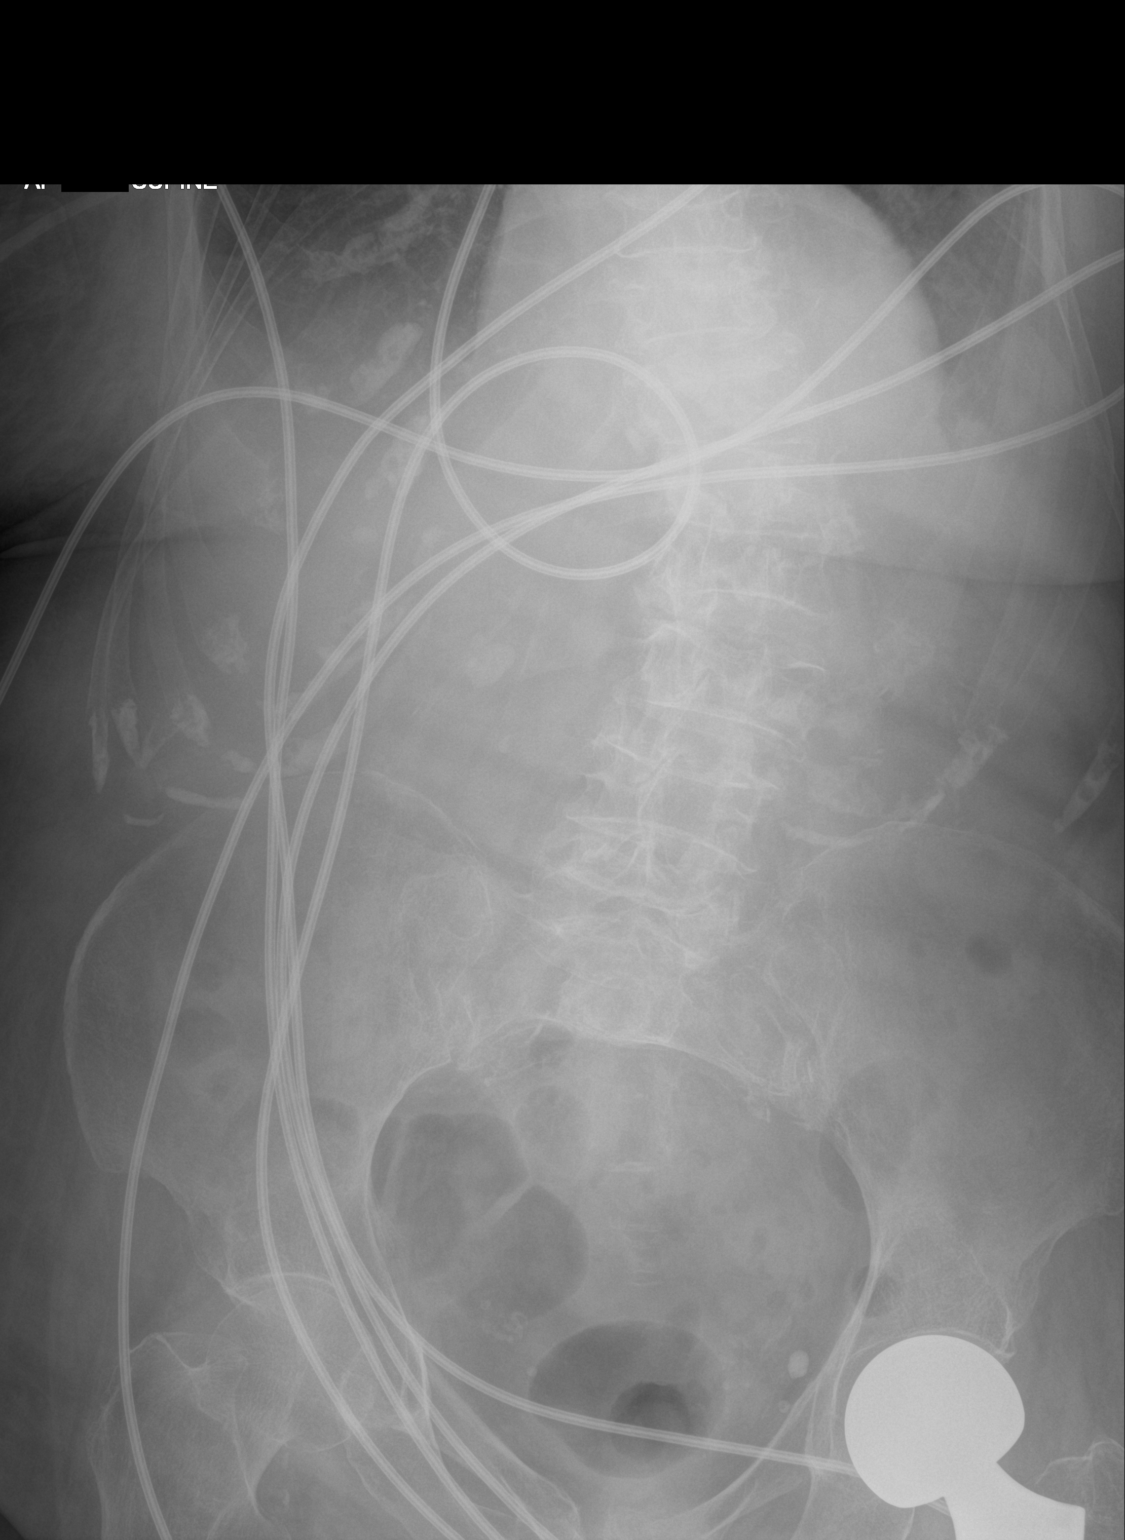

[1 of 1 positions shown; findings below may reference images not displayed]

FINDINGS: The bowel gas pattern is normal. No radio-opaque calculi or other
significant radiographic abnormality are seen.
IMPRESSION: Unremarkable supine radiograph. A nasogastric tube is not identified
in the chest or stomach.
